# Patient Record
Sex: Male | Born: 1955 | Race: White | Hispanic: No | Smoking: Former smoker
Health system: Southern US, Community
[De-identification: ages and names within clinical notes are randomized; demographics above are authoritative.]

## PROBLEM LIST (undated history)

## (undated) DIAGNOSIS — I10 Essential (primary) hypertension: Secondary | ICD-10-CM

## (undated) DIAGNOSIS — R569 Unspecified convulsions: Secondary | ICD-10-CM

## (undated) DIAGNOSIS — E7800 Pure hypercholesterolemia, unspecified: Secondary | ICD-10-CM

## (undated) HISTORY — PX: HERNIA REPAIR: SHX51

## (undated) HISTORY — PX: TONSILLECTOMY: SUR1361

---

## 2015-11-06 ENCOUNTER — Encounter (HOSPITAL_COMMUNITY): Payer: Self-pay | Admitting: Emergency Medicine

## 2015-11-06 ENCOUNTER — Observation Stay (HOSPITAL_COMMUNITY)
Admission: EM | Admit: 2015-11-06 | Discharge: 2015-11-08 | Disposition: A | Discharge status: Home / Self Care | Attending: Internal Medicine | Admitting: Internal Medicine

## 2015-11-06 ENCOUNTER — Other Ambulatory Visit: Payer: Self-pay

## 2015-11-06 DIAGNOSIS — N400 Enlarged prostate without lower urinary tract symptoms: Secondary | ICD-10-CM | POA: Diagnosis not present

## 2015-11-06 DIAGNOSIS — S82891A Other fracture of right lower leg, initial encounter for closed fracture: Secondary | ICD-10-CM

## 2015-11-06 DIAGNOSIS — M79672 Pain in left foot: Secondary | ICD-10-CM | POA: Diagnosis not present

## 2015-11-06 DIAGNOSIS — Z87891 Personal history of nicotine dependence: Secondary | ICD-10-CM | POA: Diagnosis not present

## 2015-11-06 DIAGNOSIS — E785 Hyperlipidemia, unspecified: Secondary | ICD-10-CM | POA: Insufficient documentation

## 2015-11-06 DIAGNOSIS — I4581 Long QT syndrome: Secondary | ICD-10-CM | POA: Diagnosis not present

## 2015-11-06 DIAGNOSIS — R0602 Shortness of breath: Secondary | ICD-10-CM | POA: Insufficient documentation

## 2015-11-06 DIAGNOSIS — Z79899 Other long term (current) drug therapy: Secondary | ICD-10-CM | POA: Diagnosis not present

## 2015-11-06 DIAGNOSIS — I959 Hypotension, unspecified: Secondary | ICD-10-CM | POA: Diagnosis not present

## 2015-11-06 DIAGNOSIS — R9431 Abnormal electrocardiogram [ECG] [EKG]: Secondary | ICD-10-CM | POA: Diagnosis present

## 2015-11-06 DIAGNOSIS — Z23 Encounter for immunization: Secondary | ICD-10-CM | POA: Diagnosis not present

## 2015-11-06 DIAGNOSIS — S82863A Displaced Maisonneuve's fracture of unspecified leg, initial encounter for closed fracture: Secondary | ICD-10-CM | POA: Diagnosis not present

## 2015-11-06 DIAGNOSIS — S82401A Unspecified fracture of shaft of right fibula, initial encounter for closed fracture: Secondary | ICD-10-CM | POA: Diagnosis present

## 2015-11-06 DIAGNOSIS — M79671 Pain in right foot: Secondary | ICD-10-CM | POA: Diagnosis not present

## 2015-11-06 DIAGNOSIS — M25572 Pain in left ankle and joints of left foot: Secondary | ICD-10-CM | POA: Diagnosis not present

## 2015-11-06 DIAGNOSIS — G40909 Epilepsy, unspecified, not intractable, without status epilepticus: Secondary | ICD-10-CM | POA: Diagnosis not present

## 2015-11-06 DIAGNOSIS — W19XXXA Unspecified fall, initial encounter: Secondary | ICD-10-CM | POA: Insufficient documentation

## 2015-11-06 DIAGNOSIS — E7800 Pure hypercholesterolemia, unspecified: Secondary | ICD-10-CM | POA: Insufficient documentation

## 2015-11-06 DIAGNOSIS — E876 Hypokalemia: Secondary | ICD-10-CM | POA: Diagnosis not present

## 2015-11-06 DIAGNOSIS — R55 Syncope and collapse: Principal | ICD-10-CM | POA: Diagnosis present

## 2015-11-06 DIAGNOSIS — I10 Essential (primary) hypertension: Secondary | ICD-10-CM | POA: Insufficient documentation

## 2015-11-06 DIAGNOSIS — I952 Hypotension due to drugs: Secondary | ICD-10-CM

## 2015-11-06 DIAGNOSIS — S82861A Displaced Maisonneuve's fracture of right leg, initial encounter for closed fracture: Secondary | ICD-10-CM

## 2015-11-06 HISTORY — DX: Unspecified convulsions: R56.9

## 2015-11-06 HISTORY — DX: Essential (primary) hypertension: I10

## 2015-11-06 HISTORY — DX: Pure hypercholesterolemia, unspecified: E78.00

## 2015-11-06 LAB — CBC
HCT: 39.2 % (ref 39.0–52.0)
Hemoglobin: 13.2 g/dL (ref 13.0–17.0)
MCH: 30.9 pg (ref 26.0–34.0)
MCHC: 33.7 g/dL (ref 30.0–36.0)
MCV: 91.8 fL (ref 78.0–100.0)
PLATELETS: 197 10*3/uL (ref 150–400)
RBC: 4.27 MIL/uL (ref 4.22–5.81)
RDW: 12.4 % (ref 11.5–15.5)
WBC: 8.1 10*3/uL (ref 4.0–10.5)

## 2015-11-06 MED ORDER — SODIUM CHLORIDE 0.9 % IV BOLUS (SEPSIS)
1000.0000 mL | Freq: Once | INTRAVENOUS | Status: AC
  Administered 2015-11-07: 1000 mL via INTRAVENOUS

## 2015-11-06 NOTE — ED Notes (Signed)
Bed: [id number] Expected date:  Expected time:  Means of arrival:  Comments: EMS 59yo M syncopal episode, hypotensive

## 2015-11-06 NOTE — ED Notes (Signed)
Pt presents via EMS from home c/o brief LOC and hypotension this evening.  EMS reports BP as low as 77/44 en route with a return to 88/58 after 200cc fluid bolus.  EKG NSR, all other vitals WNL.  Pt is alert and oriented, reporting hx of similar episodes as well as seizure disorder.  No acute distress.

## 2015-11-06 NOTE — ED Provider Notes (Signed)
CSN: [id number]     Arrival date & time 11/06/15  2331 History  By signing my name below, I, Doreatha Martin, attest that this documentation has been prepared under the direction and in the presence of Melene Plan, DO. Electronically Signed: Doreatha Martin, ED Scribe. 11/06/2015. 12:16 AM.    Chief Complaint  Patient presents with  . Loss of Consciousness  . Hypotension   Patient is a 60 y.o. male presenting with syncope. The history is provided by the patient. No language interpreter was used.  Loss of Consciousness Episode history:  Single Most recent episode:  Today Timing:  Constant Progression:  Resolved Chronicity:  Recurrent Context: medication change and standing up   Witnessed: yes   Relieved by:  None tried Worsened by:  Nothing tried Ineffective treatments:  None tried Associated symptoms: dizziness   Associated symptoms: no chest pain, no difficulty breathing, no fever, no headaches, no recent fall and no shortness of breath   Risk factors: no coronary artery disease     HPI Comments: Alec Brown is a 60 y.o. male with h/o seizures, HTN, hypercholesterolemia who presents to the Emergency Department due to a brief episode of syncope and witnessed fall this evening after walking to the kitchen from the couch and experiencing transient dizziness. No head injury. Pt states h/o syncopal episodes. He denies CP, SOB, HA prior to syncope. He states he has not had changes in appetite or thirst. Pt notes that he took Flomax for the first time in months this evening and notes that it causes him dizziness. Per EMS, BP was 77/44 en route and showed improvement to 88/58 after 200cc fluid bolus. EKG NSR. Pt also complains of left ankle and knee pain. [facility] [place] TDAP UTD. He is not followed by an orthopedist. He denies cough, congestion, fever, chills, abdominal pain, CP, back pain, dysuria, melena, hematochezia, hip pain.  Past Medical History  Diagnosis Date  .  Seizures (HCC)   . Hypertension   . Hypercholesterolemia    Past Surgical History  Procedure Laterality Date  . Tonsillectomy    . Hernia repair     History reviewed. No pertinent family history. Social History  Substance Use Topics  . Smoking status: Former Games developer  . Smokeless tobacco: Never Used  . Alcohol Use: Yes     Comment: occasional    Review of Systems  Constitutional: Negative for fever and chills.  HENT: Negative for congestion.   Respiratory: Negative for cough and shortness of breath.   Cardiovascular: Positive for syncope. Negative for chest pain.  Gastrointestinal: Negative for abdominal pain and blood in stool.  Musculoskeletal: Positive for arthralgias.  Neurological: Positive for dizziness and syncope. Negative for headaches.  All other systems reviewed and are negative.  Allergies  Review of patient's allergies indicates no known allergies.  Home Medications   Prior to Admission medications   Medication Sig Start Date End Date Taking? Authorizing Provider  acetaminophen (TYLENOL) 500 MG tablet Take 500 mg by mouth every 6 (six) hours as needed (for pain.).   Yes Historical Provider, MD  hydrochlorothiazide (HYDRODIURIL) 25 MG tablet Take 12.5 mg by mouth daily.   Yes Historical Provider, MD  ibuprofen (ADVIL,MOTRIN) 200 MG tablet Take 200 mg by mouth every 6 (six) hours as needed (for pain.).   Yes Historical Provider, MD  meloxicam (MOBIC) 7.5 MG tablet Take 7.5 mg by mouth daily.   Yes Historical Provider, MD  potassium chloride SA (K-DUR,KLOR-CON) 20  MEQ tablet Take 40 mEq by mouth daily.   Yes Historical Provider, MD  prazosin (MINIPRESS) 5 MG capsule Take 5 mg by mouth at bedtime.   Yes Historical Provider, MD  simvastatin (ZOCOR) 80 MG tablet Take 120 mg by mouth daily.   Yes Historical Provider, MD  tamsulosin (FLOMAX) 0.4 MG CAPS capsule Take 0.4 mg by mouth.   Yes Historical Provider, MD  traZODone (DESYREL) 50 MG tablet Take 50 mg by mouth at  bedtime.   Yes Historical Provider, MD  vardenafil (LEVITRA) 20 MG tablet Take 20 mg by mouth daily as needed for erectile dysfunction.   Yes Historical Provider, MD  venlafaxine XR (EFFEXOR-XR) 150 MG 24 hr capsule Take 150 mg by mouth daily with breakfast.   Yes Historical Provider, MD  vitamin B-12 (CYANOCOBALAMIN) 500 MCG tablet Take 1,000 mcg by mouth daily.   Yes Historical Provider, MD   BP 101/61 mmHg  Pulse 74  Temp(Src) 97.5 F (36.4 C) (Oral)  Resp 19  Ht 5\' 8"  (1.727 m)  Wt 235 lb (106.595 kg)  BMI 35.74 kg/m2  SpO2 94% Physical Exam  Constitutional: He is oriented to person, place, and time. He appears well-developed and well-nourished.  HENT:  Head: Normocephalic and atraumatic.  Eyes: Conjunctivae and EOM are normal. Pupils are equal, round, and reactive to light.  Neck: Normal range of motion. Neck supple.  Cardiovascular: Normal rate.   Pulmonary/Chest: Effort normal. No respiratory distress.  Abdominal: He exhibits no distension.  Musculoskeletal: Normal range of motion. He exhibits tenderness.       Right ankle: He exhibits deformity.  Crepitance and deformity of the right ankle. Pain with palpation of the fibular head. Pulse, motor and sensation intact distally and bilaterally. No hip pain or pain with interior or exterior rotation of the hip.   Neurological: He is alert and oriented to person, place, and time.  Skin: Skin is warm and dry.  Psychiatric: He has a normal mood and affect. His behavior is normal.  Nursing note and vitals reviewed.   ED Course  Procedures (including critical care time) DIAGNOSTIC STUDIES: Oxygen Saturation is 92% on RA, low by my interpretation.    COORDINATION OF CARE: 12:03 AM Discussed treatment plan with pt at bedside and pt agreed to plan.   Labs Review Labs Reviewed  BASIC METABOLIC PANEL - Abnormal; Notable for the following:    Potassium 3.2 (*)    Glucose, Bld 138 (*)    Calcium 8.4 (*)    All other components  within normal limits  URINALYSIS, ROUTINE W REFLEX MICROSCOPIC (NOT AT [facility]) - Abnormal; Notable for the following:    APPearance CLOUDY (*)    All other components within normal limits  CBG MONITORING, ED - Abnormal; Notable for the following:    Glucose-Capillary 124 (*)    All other components within normal limits  CBC  BASIC METABOLIC PANEL  I-STAT TROPOININ, ED    Imaging Review Dg Chest 2 View  11/07/2015  CLINICAL DATA:  Acute onset of seizure and fall. Shortness of breath. Initial encounter. EXAM: CHEST  2 VIEW COMPARISON:  None. FINDINGS: The lungs are well-aerated and clear. There is no evidence of focal opacification, pleural effusion or pneumothorax. The heart is normal in size; the mediastinal contour is within normal limits. No acute osseous abnormalities are seen. IMPRESSION: No acute cardiopulmonary process seen. No displaced rib fractures identified. Electronically Signed   By: Roanna Raider M.D.   On: 11/07/2015 02:03  Dg Knee 2 Views Right  11/07/2015  CLINICAL DATA:  Status post seizure and fall. Right lateral knee pain. Initial encounter. EXAM: RIGHT KNEE - 1-2 VIEW COMPARISON:  None. FINDINGS: There is a minimally displaced fracture through the proximal fibular diaphysis, demonstrating mild anterior and lateral displacement. No additional fractures are seen. No knee joint effusion is seen. The knee joint is grossly unremarkable in appearance, aside from mild marginal osteophyte formation at the medial compartment. Mild soft tissue swelling is noted overlying the fracture. IMPRESSION: Minimally displaced fracture through the proximal fibular diaphysis, demonstrating mild anterior and lateral displacement. Electronically Signed   By: Roanna Raider M.D.   On: 11/07/2015 02:08   Dg Ankle Complete Left  11/07/2015  CLINICAL DATA:  Status post fall, with left lateral ankle and foot pain. Initial encounter. EXAM: LEFT ANKLE COMPLETE - 3+ VIEW COMPARISON:  None. FINDINGS: There is  no evidence of fracture or dislocation. The ankle mortise is intact; the interosseous space is within normal limits. No talar tilt or subluxation is seen. The joint spaces are preserved. No significant soft tissue abnormalities are seen. IMPRESSION: No evidence of fracture or dislocation. Electronically Signed   By: Roanna Raider M.D.   On: 11/07/2015 02:09   Dg Ankle Complete Right  11/07/2015  CLINICAL DATA:  Status post seizure and fall. Right medial ankle abrasion. Initial encounter. EXAM: RIGHT ANKLE - COMPLETE 3+ VIEW COMPARISON:  None. FINDINGS: There is a mildly displaced fracture through the distal fibula. No additional fractures are seen. The ankle mortise is grossly unremarkable in appearance. The interosseous space appears grossly intact. The subtalar joint is unremarkable. Os trigonum fragments are noted. Lateral soft tissue swelling is noted. IMPRESSION: 1. Mildly displaced fracture through the distal fibula. 2. Os trigonum fragments noted. Electronically Signed   By: Roanna Raider M.D.   On: 11/07/2015 02:11   Dg Foot Complete Left  11/07/2015  CLINICAL DATA:  Status post seizure and fall. Left foot pain. Initial encounter. EXAM: LEFT FOOT - COMPLETE 3+ VIEW COMPARISON:  None. FINDINGS: There is no evidence of fracture or dislocation. The joint spaces are preserved. There is no evidence of talar subluxation; the subtalar joint is unremarkable in appearance. Mild hallux valgus is noted. No significant soft tissue abnormalities are seen. IMPRESSION: 1. No evidence of fracture or dislocation. 2. Mild hallux valgus noted. Electronically Signed   By: Roanna Raider M.D.   On: 11/07/2015 02:06   Dg Foot Complete Right  11/07/2015  CLINICAL DATA:  Status post seizure and fall. Medial right foot pain. Initial encounter. EXAM: RIGHT FOOT COMPLETE - 3+ VIEW COMPARISON:  None. FINDINGS: There is no evidence of fracture or dislocation. The joint spaces are preserved. There is no evidence of talar  subluxation; the subtalar joint is unremarkable in appearance. An os trigonum is noted. No significant soft tissue abnormalities are seen. IMPRESSION: 1. No evidence of fracture or dislocation. 2. Os trigonum noted. Electronically Signed   By: Roanna Raider M.D.   On: 11/07/2015 02:04   I have personally reviewed and evaluated these images and lab results as part of my medical decision-making.   EKG Interpretation   Date/Time:  Monday November 06 2015 23:38:19 EST Ventricular Rate:  74 PR Interval:  150 QRS Duration: 101 QT Interval:  462 QTC Calculation: 513 R Axis:   35 Text Interpretation:  Sinus rhythm Borderline low voltage, extremity leads  Prolonged QT interval No old tracing to compare Confirmed by Donyae Kilner MD,  DANIEL [phone])  on 11/07/2015 6:51:08 AM      MDM   Final diagnoses:  Vasovagal syncope  Closed right ankle fracture, initial encounter  Maisonneuve fracture of fibula, right, closed, initial encounter  Hypotension due to drugs    60 yo M with a chief complaints of syncope. Patient took his Flomax for the first time in a few months stood up and felt lightheaded was only able to make a few steps and then passed out. In the injury rolled his ankle and having severe right ankle and right knee pain. Some mild left foot pain as well. Denied head injury denies neck pain back pain chest pain abdominal pain.  Plain films of the right ankle with distal and proximal fibular fx.  The patient was given 2 L of IV fluids with persistent hypotension. Will admit.  CRITICAL CARE Performed by: Rae Roamaniel Patrick Valynn Schamberger   Total critical care time: 40 minutes  Critical care time was exclusive of separately billable procedures and treating other patients.  Critical care was necessary to treat or prevent imminent or life-threatening deterioration.  Critical care was time spent personally by me on the following activities: development of treatment plan with patient and/or surrogate as well  as nursing, discussions with consultants, evaluation of patient's response to treatment, examination of patient, obtaining history from patient or surrogate, ordering and performing treatments and interventions, ordering and review of laboratory studies, ordering and review of radiographic studies, pulse oximetry and re-evaluation of patient's condition.  I personally performed the services described in this documentation, which was scribed in my presence. The recorded information has been reviewed and is accurate.    The patients results and plan were reviewed and discussed.   Any x-rays performed were independently reviewed by myself.   Differential diagnosis were considered with the presenting HPI.  Medications  acetaminophen (TYLENOL) tablet 500 mg (not administered)  ibuprofen (ADVIL,MOTRIN) tablet 200 mg (not administered)  meloxicam (MOBIC) tablet 7.5 mg (not administered)  potassium chloride SA (K-DUR,KLOR-CON) CR tablet 40 mEq (not administered)  atorvastatin (LIPITOR) tablet 40 mg (not administered)  venlafaxine XR (EFFEXOR-XR) 24 hr capsule 150 mg (not administered)  vitamin B-12 (CYANOCOBALAMIN) tablet 1,000 mcg (not administered)  enoxaparin (LOVENOX) injection 40 mg (not administered)  sodium chloride 0.9 % injection 3 mL (not administered)  0.9 %  sodium chloride infusion ( Intravenous New Bag/Given 11/07/15 0458)  HYDROcodone-acetaminophen (NORCO/VICODIN) 5-325 MG per tablet 1 tablet (not administered)  ondansetron (ZOFRAN) tablet 4 mg (not administered)    Or  ondansetron (ZOFRAN) injection 4 mg (not administered)  morphine 2 MG/ML injection 2 mg (not administered)  Influenza vac split quadrivalent PF (FLUARIX) injection 0.5 mL (not administered)  sodium chloride 0.9 % bolus 1,000 mL (0 mLs Intravenous Stopped 11/07/15 0238)  fentaNYL (SUBLIMAZE) injection 50 mcg (50 mcg Intravenous Given 11/07/15 0030)  sodium chloride 0.9 % bolus 1,000 mL (1,000 mLs Intravenous New Bag/Given  11/07/15 0242)  potassium chloride SA (K-DUR,KLOR-CON) CR tablet 40 mEq (40 mEq Oral Given 11/07/15 0257)  fentaNYL (SUBLIMAZE) injection 50 mcg (50 mcg Intravenous Given 11/07/15 0422)    Filed Vitals:   11/07/15 0100 11/07/15 0115 11/07/15 0420 11/07/15 0449  BP: 68/46 88/58 101/63 101/61  Pulse: 75 74 75 74  Temp:    97.5 F (36.4 C)  TempSrc:    Oral  Resp: 15 15 16 19   Height:    5\' 8"  (1.727 m)  Weight:      SpO2: 91% 90% 94% 94%    Final  diagnoses:  Vasovagal syncope  Closed right ankle fracture, initial encounter  Maisonneuve fracture of fibula, right, closed, initial encounter  Hypotension due to drugs    Admission/ observation were discussed with the admitting physician, patient and/or family and they are comfortable with the plan.    Melene Plan, DO 11/07/15 [phone]

## 2015-11-07 ENCOUNTER — Emergency Department (HOSPITAL_COMMUNITY)

## 2015-11-07 DIAGNOSIS — S82863A Displaced Maisonneuve's fracture of unspecified leg, initial encounter for closed fracture: Secondary | ICD-10-CM | POA: Diagnosis not present

## 2015-11-07 DIAGNOSIS — I10 Essential (primary) hypertension: Secondary | ICD-10-CM | POA: Diagnosis not present

## 2015-11-07 DIAGNOSIS — S82891A Other fracture of right lower leg, initial encounter for closed fracture: Secondary | ICD-10-CM | POA: Insufficient documentation

## 2015-11-07 DIAGNOSIS — N400 Enlarged prostate without lower urinary tract symptoms: Secondary | ICD-10-CM | POA: Diagnosis present

## 2015-11-07 DIAGNOSIS — S82401A Unspecified fracture of shaft of right fibula, initial encounter for closed fracture: Secondary | ICD-10-CM | POA: Diagnosis present

## 2015-11-07 DIAGNOSIS — I959 Hypotension, unspecified: Secondary | ICD-10-CM | POA: Diagnosis present

## 2015-11-07 DIAGNOSIS — R55 Syncope and collapse: Secondary | ICD-10-CM | POA: Diagnosis present

## 2015-11-07 DIAGNOSIS — E876 Hypokalemia: Secondary | ICD-10-CM | POA: Diagnosis not present

## 2015-11-07 DIAGNOSIS — R9431 Abnormal electrocardiogram [ECG] [EKG]: Secondary | ICD-10-CM | POA: Diagnosis present

## 2015-11-07 LAB — BASIC METABOLIC PANEL
ANION GAP: 6 (ref 5–15)
BUN: 14 mg/dL (ref 6–20)
CHLORIDE: 105 mmol/L (ref 101–111)
CO2: 28 mmol/L (ref 22–32)
Calcium: 8.2 mg/dL — ABNORMAL LOW (ref 8.9–10.3)
Creatinine, Ser: 1.09 mg/dL (ref 0.61–1.24)
Glucose, Bld: 135 mg/dL — ABNORMAL HIGH (ref 65–99)
POTASSIUM: 3.6 mmol/L (ref 3.5–5.1)
SODIUM: 139 mmol/L (ref 135–145)

## 2015-11-07 LAB — CBG MONITORING, ED: GLUCOSE-CAPILLARY: 124 mg/dL — AB (ref 65–99)

## 2015-11-07 LAB — URINALYSIS, ROUTINE W REFLEX MICROSCOPIC
Bilirubin Urine: NEGATIVE
Glucose, UA: NEGATIVE mg/dL
Hgb urine dipstick: NEGATIVE
Ketones, ur: NEGATIVE mg/dL
Leukocytes, UA: NEGATIVE
Nitrite: NEGATIVE
Protein, ur: NEGATIVE mg/dL
Specific Gravity, Urine: 1.011 (ref 1.005–1.030)
pH: 6.5 (ref 5.0–8.0)

## 2015-11-07 LAB — BASIC METABOLIC PANEL WITH GFR
Anion gap: 10 (ref 5–15)
BUN: 12 mg/dL (ref 6–20)
CO2: 27 mmol/L (ref 22–32)
Calcium: 8.4 mg/dL — ABNORMAL LOW (ref 8.9–10.3)
Chloride: 105 mmol/L (ref 101–111)
Creatinine, Ser: 1.08 mg/dL (ref 0.61–1.24)
GFR calc Af Amer: >60 mL/min
GFR calc non Af Amer: >60 mL/min
Glucose, Bld: 138 mg/dL — ABNORMAL HIGH (ref 65–99)
Potassium: 3.2 mmol/L — ABNORMAL LOW (ref 3.5–5.1)
Sodium: 142 mmol/L (ref 135–145)

## 2015-11-07 LAB — I-STAT TROPONIN, ED: TROPONIN I, POC: 0 ng/mL (ref 0.00–0.08)

## 2015-11-07 MED ORDER — FENTANYL CITRATE (PF) 100 MCG/2ML IJ SOLN
50.0000 ug | Freq: Once | INTRAMUSCULAR | Status: AC
Start: 2015-11-07 — End: 2015-11-07
  Administered 2015-11-07: 50 ug via INTRAVENOUS
  Filled 2015-11-07: qty 2

## 2015-11-07 MED ORDER — SODIUM CHLORIDE 0.9 % IV SOLN
INTRAVENOUS | Status: DC
  Administered 2015-11-07: 05:00:00 via INTRAVENOUS

## 2015-11-07 MED ORDER — ONDANSETRON HCL 4 MG/2ML IJ SOLN: 4.0000 mg | Freq: Four times a day (QID) | INTRAMUSCULAR | Status: DC | PRN

## 2015-11-07 MED ORDER — SODIUM CHLORIDE 0.9 % IJ SOLN
3.0000 mL | Freq: Two times a day (BID) | INTRAMUSCULAR | Status: DC
  Administered 2015-11-07 – 2015-11-08 (×3): 3 mL via INTRAVENOUS

## 2015-11-07 MED ORDER — ATORVASTATIN CALCIUM 40 MG PO TABS
40.0000 mg | ORAL_TABLET | Freq: Every day | ORAL | Status: DC
  Administered 2015-11-07: 40 mg via ORAL
  Filled 2015-11-07: qty 1

## 2015-11-07 MED ORDER — POTASSIUM CHLORIDE CRYS ER 20 MEQ PO TBCR
40.0000 meq | EXTENDED_RELEASE_TABLET | Freq: Every day | ORAL | Status: DC
  Administered 2015-11-07 – 2015-11-08 (×2): 40 meq via ORAL
  Filled 2015-11-07 (×2): qty 2

## 2015-11-07 MED ORDER — ENOXAPARIN SODIUM 40 MG/0.4ML ~~LOC~~ SOLN
40.0000 mg | SUBCUTANEOUS | Status: DC
  Administered 2015-11-07 – 2015-11-08 (×2): 40 mg via SUBCUTANEOUS
  Filled 2015-11-07 (×2): qty 0.4

## 2015-11-07 MED ORDER — SODIUM CHLORIDE 0.9 % IV SOLN
INTRAVENOUS | Status: DC
  Administered 2015-11-07 (×2): via INTRAVENOUS

## 2015-11-07 MED ORDER — MELOXICAM 7.5 MG PO TABS
7.5000 mg | ORAL_TABLET | Freq: Every day | ORAL | Status: DC
  Administered 2015-11-07 – 2015-11-08 (×2): 7.5 mg via ORAL
  Filled 2015-11-07 (×2): qty 1

## 2015-11-07 MED ORDER — SODIUM CHLORIDE 0.9 % IV BOLUS (SEPSIS)
1000.0000 mL | Freq: Once | INTRAVENOUS | Status: AC
  Administered 2015-11-07: 1000 mL via INTRAVENOUS

## 2015-11-07 MED ORDER — FENTANYL CITRATE (PF) 100 MCG/2ML IJ SOLN
50.0000 ug | Freq: Once | INTRAMUSCULAR | Status: AC
  Administered 2015-11-07: 50 ug via INTRAVENOUS
  Filled 2015-11-07: qty 2

## 2015-11-07 MED ORDER — VENLAFAXINE HCL ER 150 MG PO CP24
150.0000 mg | ORAL_CAPSULE | Freq: Every day | ORAL | Status: DC
  Administered 2015-11-07 – 2015-11-08 (×2): 150 mg via ORAL
  Filled 2015-11-07: qty 1
  Filled 2015-11-07: qty 2
  Filled 2015-11-07 (×2): qty 1
  Filled 2015-11-07: qty 2

## 2015-11-07 MED ORDER — IBUPROFEN 200 MG PO TABS: 200.0000 mg | ORAL_TABLET | Freq: Four times a day (QID) | ORAL | Status: DC | PRN

## 2015-11-07 MED ORDER — ONDANSETRON HCL 4 MG PO TABS: 4.0000 mg | ORAL_TABLET | Freq: Four times a day (QID) | ORAL | Status: DC | PRN

## 2015-11-07 MED ORDER — HYDROCODONE-ACETAMINOPHEN 5-325 MG PO TABS
1.0000 | ORAL_TABLET | ORAL | Status: DC | PRN
  Administered 2015-11-07 – 2015-11-08 (×6): 1 via ORAL
  Filled 2015-11-07 (×6): qty 1

## 2015-11-07 MED ORDER — MORPHINE SULFATE (PF) 2 MG/ML IV SOLN: 2.0000 mg | INTRAVENOUS | Status: DC | PRN

## 2015-11-07 MED ORDER — VITAMIN B-12 1000 MCG PO TABS
1000.0000 ug | ORAL_TABLET | Freq: Every day | ORAL | Status: DC
  Administered 2015-11-07 – 2015-11-08 (×2): 1000 ug via ORAL
  Filled 2015-11-07 (×2): qty 1

## 2015-11-07 MED ORDER — ACETAMINOPHEN 500 MG PO TABS: 500.0000 mg | ORAL_TABLET | Freq: Four times a day (QID) | ORAL | Status: DC | PRN

## 2015-11-07 MED ORDER — INFLUENZA VAC SPLIT QUAD 0.5 ML IM SUSY
0.5000 mL | PREFILLED_SYRINGE | INTRAMUSCULAR | Status: AC
  Administered 2015-11-08: 0.5 mL via INTRAMUSCULAR
  Filled 2015-11-07 (×2): qty 0.5

## 2015-11-07 MED ORDER — POTASSIUM CHLORIDE CRYS ER 20 MEQ PO TBCR
40.0000 meq | EXTENDED_RELEASE_TABLET | Freq: Once | ORAL | Status: AC
  Administered 2015-11-07: 40 meq via ORAL
  Filled 2015-11-07: qty 2

## 2015-11-07 NOTE — Evaluation (Signed)
Physical Therapy Evaluation Patient Details Name: Alec Brown MRN: [id number] DOB: October 21, 1956 Today's Date: 11/07/2015   History of Present Illness  Alec Brown is a 60 y.o. male who complains of Right ankle injury after syncopal episode 11/05/14. Xrays in ED showed distal fibula fx and fibular neck fx. Splint applied in ED. NWB R foot  Clinical Impression  Patient  Presents with decreased  abilty to ambulate with NWB on the Right. Instructed in pivot transfers to from [facility]. Did perform 1 stand pivot transfer with RW with mod assist.. Reports tenderness of the medial Right  ankle with weight bearing, somewhat limiting.patient will benefit from  PT while in acute care to improve in safe transfers to DC to home. Patient indicates that he feels  That he can ambulate at DC.  REcommend  A WC  At least initially as patient's transfer unsafe.   Follow Up Recommendations Home health PT;Supervision/Assistance - 24 hour    Equipment Recommendations  Wheelchair (measurements PT)    Recommendations for Other Services       Precautions / Restrictions Precautions Precautions: Fall Precaution Comments: syncope spell at home Restrictions Weight Bearing Restrictions: Yes RLE Weight Bearing: Non weight bearing      Mobility  Bed Mobility Overal bed mobility: Needs Assistance Bed Mobility: Supine to Sit;Sit to Supine     Supine to sit: Min assist Sit to supine: Supervision   General bed mobility comments: assist with the trunk upright.  Transfers Overall transfer level: Needs assistance Equipment used: Rolling walker (2 wheeled);Pushed w/c Transfers: Sit to/from [employer] Sit to Stand: Mod assist Stand pivot transfers: Mod assist       General transfer comment: from bed  with mod max assist to stand. Noted difficulty powering up to stand with L leg only. Patient noted to stand somewhat easier from toilet with the rail, still requiring min assist for safety. Patient able to  maintain NWB on the R. Patient then transferred with stand and pivot hops with RW with mod assist for safety. fro WC to bed.  Ambulation/Gait             General Gait Details: NT, not ready to hop due to LLE not strong enough. needs a L shoe also.  Stairs            Wheelchair Mobility    Modified Rankin (Stroke Patients Only)       Balance Overall balance assessment: History of Falls                                           Pertinent Vitals/Pain Pain Assessment: 0-10 Pain Score: 8  Pain Location: right foot Pain Descriptors / Indicators: Aching;Burning;Tightness Pain Intervention(s): Limited activity within patient's tolerance;Monitored during session;Repositioned;Ice applied;Premedicated before session    Home Living Family/patient expects to be discharged to:: Private residence Living Arrangements: Spouse/significant other;Other relatives Available Help at Discharge: Family;Friend(s);Available 24 hours/day Type of Home: Apartment Home Access: Level entry     Home Layout: One level Home Equipment: Walker - 2 wheels;Crutches      Prior Function Level of Independence: Independent               Hand Dominance        Extremity/Trunk Assessment   Upper Extremity Assessment: Overall WFL for tasks assessed           Lower Extremity Assessment:  LLE deficits/detail;RLE deficits/detail RLE Deficits / Details: able to lift from bed LLE Deficits / Details: difficulty with sit to stand from low surface, power up assist. did better with a rail in the bathroom and [facility] with [facility] rests.  Cervical / Trunk Assessment: Normal  Communication   Communication: No difficulties  Cognition Arousal/Alertness: Awake/alert Behavior During Therapy: Impulsive Overall Cognitive Status: Within Functional Limits for tasks assessed                      General Comments      Exercises        Assessment/Plan    PT Assessment  Patient needs continued PT services  PT Diagnosis Difficulty walking;Generalized weakness;Acute pain   PT Problem List Decreased strength;Decreased activity tolerance;Decreased balance;Decreased mobility;Decreased knowledge of use of DME;Decreased safety awareness;Decreased knowledge of precautions  PT Treatment Interventions DME instruction;Gait training;Functional mobility training;Therapeutic activities;Therapeutic exercise;Patient/family education   PT Goals (Current goals can be found in the Care Plan section) Acute Rehab PT Goals Patient Stated Goal: I can walk with that RW. PT Goal Formulation: With patient/family Time For Goal Achievement: 11/21/15 Potential to Achieve Goals: Good    Frequency Min 6X/week   Barriers to discharge        Co-evaluation               End of Session Equipment Utilized During Treatment: Gait belt Activity Tolerance: Patient tolerated treatment well;Patient limited by pain Patient left: in bed;with call bell/phone within reach;with bed alarm set;with family/visitor present Nurse Communication: Mobility status;Precautions    Functional Assessment Tool Used: clinical judgemnt Functional Limitation: Mobility: Walking and moving around Mobility: Walking and Moving Around Current Status ([id number](G8978): At least 80 percent but less than 100 percent impaired, limited or restricted Mobility: Walking and Moving Around Goal Status [phone](G8979): At least 1 percent but less than 20 percent impaired, limited or restricted    Time: [id number] PT Time Calculation (min) (ACUTE ONLY): 50 min   Charges:   PT Evaluation $PT Eval Moderate Complexity: 1 Procedure PT Treatments $Therapeutic Activity: 23-37 mins   PT G Codes:   PT G-Codes **NOT FOR INPATIENT CLASS** Functional Assessment Tool Used: clinical judgemnt Functional Limitation: Mobility: Walking and moving around Mobility: Walking and Moving Around Current Status (Z3664(G8978): At least 80 percent but less than  100 percent impaired, limited or restricted Mobility: Walking and Moving Around Goal Status [phone](G8979): At least 1 percent but less than 20 percent impaired, limited or restricted    Rada HayHill, Saurabh Hettich [name] 11/07/2015, 2:45 PM Blanchard KelchKaren Mana Morison PT [phone]

## 2015-11-07 NOTE — Progress Notes (Signed)
PT Cancellation Note  Patient Details Name: Alec MulletStacy Brown MRN: [id number] DOB: 1955-11-11   Cancelled Treatment:    Reason Eval/Treat Not Completed: Patient not medically ready (note fibular fxs.with no weight bearing status ordered. Will hold PT eval until clarified. ) no indication of ortho consult noted. Will follow up later for clarification.    Alec HayHill, Alec Brown [name] 11/07/2015, 7:00 AM Alec KelchKaren Tracee Brown PT [phone]

## 2015-11-07 NOTE — Progress Notes (Signed)
Patient reported he has an appointment with DR Jeri LagerGuha today, so he remembered to take him meds ( prazosin, flomax and trazodone) last night which he has not been taking for a long time, syncope happened two hrs after taking these meds, per girlfriend, no witnessed seizure activity.  He presented with hypotension, he has orthostatic hypotension, home bp meds held, on ivf, check tsh/corticol. Right ankle injury, plan per orhto  PMD Dr Carleene CooperGuha, [facility] [phone].

## 2015-11-07 NOTE — H&P (Signed)
[facility] History and Physical  Alec Brown ZOX:[id number] DOB: 1956/10/05 DOA: 11/06/2015  Referring physician: ED PCP: Pcp Not In System   Chief Complaint: Syncope  HPI:  Alec Brown is a 60 year old male with a past medical history of seizure disorder, hypertension, hyperlipidemia, BPH; who presents to the emergency department after having a syncopal event. Symptoms occurred around 10:30 PM after he had just taken his nighttime medications of trazodone, Flomax, and prazosin. This was the first time he had taken Flomax which was just prescribed. As he was walking from the couch he noted filling lightheaded. His significant other notes that she found him laying on the floor slightly confused and dazed for a few minutes. Patient also denies recently taking his erectile dysfunction medication. Denies any trauma to the head, possible bowel or bladder, seizure-like activity, or tongue biting. Denies having any syncopal episodes in the past. Yesterday, he took his blood pressure and his systolic blood pressure was in the 130's. He complains of left ankle and knee pain. Pain is not sharp in nature and worsened by any movement of the left leg.      Review of Systems  Constitutional: Negative for fever and chills.  HENT: Negative for ear discharge, ear pain and hearing loss.   Eyes: Negative for double vision and photophobia.  Respiratory: Negative for hemoptysis and sputum production.   Cardiovascular: Positive for leg swelling. Negative for chest pain and palpitations.  Gastrointestinal: Negative for nausea, vomiting and abdominal pain.  Genitourinary: Negative for urgency and frequency.  Musculoskeletal: Positive for joint pain and falls.  Skin: Negative for itching and rash.  Neurological: Positive for loss of consciousness. Negative for sensory change and seizures.  Endo/Heme/Allergies: Negative for environmental allergies and polydipsia.        Past Medical History  Diagnosis  Date  . Seizures (HCC)   . Hypertension   . Hypercholesterolemia      Past Surgical History  Procedure Laterality Date  . Tonsillectomy    . Hernia repair        Social History:  reports that he has quit smoking. He has never used smokeless tobacco. He reports that he drinks alcohol. He reports that he does not use illicit drugs. Where does patient live--home and with whom if at home? Can patient participate in ADLs? Yes  No Known Allergies  History reviewed. No pertinent family history.    FAMILY HISTORY  When questioned  Directly-patient reports  No family history of HTN, CVA ,DIABETES, TB, Cancer CAD, Bleeding Disorders, Sickle Cell, diabetes, anemia, asthma,   Prior to Admission medications   Medication Sig Start Date End Date Taking? Authorizing Provider  acetaminophen (TYLENOL) 500 MG tablet Take 500 mg by mouth every 6 (six) hours as needed (for pain.).   Yes Historical Provider, MD  hydrochlorothiazide (HYDRODIURIL) 25 MG tablet Take 12.5 mg by mouth daily.   Yes Historical Provider, MD  ibuprofen (ADVIL,MOTRIN) 200 MG tablet Take 200 mg by mouth every 6 (six) hours as needed (for pain.).   Yes Historical Provider, MD  meloxicam (MOBIC) 7.5 MG tablet Take 7.5 mg by mouth daily.   Yes Historical Provider, MD  potassium chloride SA (K-DUR,KLOR-CON) 20 MEQ tablet Take 40 mEq by mouth daily.   Yes Historical Provider, MD  prazosin (MINIPRESS) 5 MG capsule Take 5 mg by mouth at bedtime.   Yes Historical Provider, MD  simvastatin (ZOCOR) 80 MG tablet Take 120 mg by mouth daily.   Yes Historical Provider, MD  tamsulosin (  FLOMAX) 0.4 MG CAPS capsule Take 0.4 mg by mouth.   Yes Historical Provider, MD  traZODone (DESYREL) 50 MG tablet Take 50 mg by mouth at bedtime.   Yes Historical Provider, MD  vardenafil (LEVITRA) 20 MG tablet Take 20 mg by mouth daily as needed for erectile dysfunction.   Yes Historical Provider, MD  venlafaxine XR (EFFEXOR-XR) 150 MG 24 hr capsule Take 150  mg by mouth daily with breakfast.   Yes Historical Provider, MD  vitamin B-12 (CYANOCOBALAMIN) 500 MCG tablet Take 1,000 mcg by mouth daily.   Yes Historical Provider, MD     Physical Exam: Filed Vitals:   11/07/15 0030 11/07/15 0045 11/07/15 0100 11/07/15 0115  BP: 81/57 75/50 68/46  88/58  Pulse: 68 77 75 74  Temp:      TempSrc:      Resp: 17 16 15 15   Height:      Weight:      SpO2: 92% 93% 91% 90%     Constitutional: Vital signs reviewed. Patient is a well-developed and well-nourished in no acute distress and cooperative with exam. Alert and oriented x3.  Head: Normocephalic and atraumatic  Ear: TM normal bilaterally  Mouth: no erythema or exudates, MMM  Eyes: PERRL, EOMI, conjunctivae normal, No scleral icterus.  Neck: Supple, Trachea midline normal ROM, No JVD, mass, thyromegaly, or carotid bruit present.  Cardiovascular: RRR, S1 normal, S2 normal, no MRG, pulses symmetric and intact bilaterally  Pulmonary/Chest: CTAB, no wheezes, rales, or rhonchi  Abdominal: Soft. Non-tender, non-distended, bowel sounds are normal, no masses, organomegaly, or guarding present.  GU: no CVA tenderness Musculoskeletal: No joint deformities, erythema, or stiffness, decreased range of motion to the right ankle secondary to pain  Ext: no edema and no cyanosis, pulses palpable bilaterally (DP and PT)  Hematology: no cervical, inginal, or axillary adenopathy.  Neurological: A&O x3, Strenght is normal and symmetric bilaterally, cranial nerve II-XII are grossly intact, no focal motor deficit, sensory intact to light touch bilaterally.  Skin: Warm, dry and intact. No rash, cyanosis, or clubbing.  Psychiatric: Normal mood and affect. speech and behavior is normal. Judgment and thought content normal. Cognition and memory are normal.      Data Review   Micro Results No results found for this or any previous visit (from the past 240 hour(s)).  Radiology Reports Dg Chest 2 View  11/07/2015   CLINICAL DATA:  Acute onset of seizure and fall. Shortness of breath. Initial encounter. EXAM: CHEST  2 VIEW COMPARISON:  None. FINDINGS: The lungs are well-aerated and clear. There is no evidence of focal opacification, pleural effusion or pneumothorax. The heart is normal in size; the mediastinal contour is within normal limits. No acute osseous abnormalities are seen. IMPRESSION: No acute cardiopulmonary process seen. No displaced rib fractures identified. Electronically Signed   By: Roanna RaiderJeffery  Chang M.D.   On: 11/07/2015 02:03   Dg Knee 2 Views Right  11/07/2015  CLINICAL DATA:  Status post seizure and fall. Right lateral knee pain. Initial encounter. EXAM: RIGHT KNEE - 1-2 VIEW COMPARISON:  None. FINDINGS: There is a minimally displaced fracture through the proximal fibular diaphysis, demonstrating mild anterior and lateral displacement. No additional fractures are seen. No knee joint effusion is seen. The knee joint is grossly unremarkable in appearance, aside from mild marginal osteophyte formation at the medial compartment. Mild soft tissue swelling is noted overlying the fracture. IMPRESSION: Minimally displaced fracture through the proximal fibular diaphysis, demonstrating mild anterior and lateral displacement. Electronically Signed  By: Roanna Raider M.D.   On: 11/07/2015 02:08   Dg Ankle Complete Left  11/07/2015  CLINICAL DATA:  Status post fall, with left lateral ankle and foot pain. Initial encounter. EXAM: LEFT ANKLE COMPLETE - 3+ VIEW COMPARISON:  None. FINDINGS: There is no evidence of fracture or dislocation. The ankle mortise is intact; the interosseous space is within normal limits. No talar tilt or subluxation is seen. The joint spaces are preserved. No significant soft tissue abnormalities are seen. IMPRESSION: No evidence of fracture or dislocation. Electronically Signed   By: Roanna Raider M.D.   On: 11/07/2015 02:09   Dg Ankle Complete Right  11/07/2015  CLINICAL DATA:  Status post  seizure and fall. Right medial ankle abrasion. Initial encounter. EXAM: RIGHT ANKLE - COMPLETE 3+ VIEW COMPARISON:  None. FINDINGS: There is a mildly displaced fracture through the distal fibula. No additional fractures are seen. The ankle mortise is grossly unremarkable in appearance. The interosseous space appears grossly intact. The subtalar joint is unremarkable. Os trigonum fragments are noted. Lateral soft tissue swelling is noted. IMPRESSION: 1. Mildly displaced fracture through the distal fibula. 2. Os trigonum fragments noted. Electronically Signed   By: Roanna Raider M.D.   On: 11/07/2015 02:11   Dg Foot Complete Left  11/07/2015  CLINICAL DATA:  Status post seizure and fall. Left foot pain. Initial encounter. EXAM: LEFT FOOT - COMPLETE 3+ VIEW COMPARISON:  None. FINDINGS: There is no evidence of fracture or dislocation. The joint spaces are preserved. There is no evidence of talar subluxation; the subtalar joint is unremarkable in appearance. Mild hallux valgus is noted. No significant soft tissue abnormalities are seen. IMPRESSION: 1. No evidence of fracture or dislocation. 2. Mild hallux valgus noted. Electronically Signed   By: Roanna Raider M.D.   On: 11/07/2015 02:06   Dg Foot Complete Right  11/07/2015  CLINICAL DATA:  Status post seizure and fall. Medial right foot pain. Initial encounter. EXAM: RIGHT FOOT COMPLETE - 3+ VIEW COMPARISON:  None. FINDINGS: There is no evidence of fracture or dislocation. The joint spaces are preserved. There is no evidence of talar subluxation; the subtalar joint is unremarkable in appearance. An os trigonum is noted. No significant soft tissue abnormalities are seen. IMPRESSION: 1. No evidence of fracture or dislocation. 2. Os trigonum noted. Electronically Signed   By: Roanna Raider M.D.   On: 11/07/2015 02:04     CBC  Recent Labs Lab 11/06/15 2351  WBC 8.1  HGB 13.2  HCT 39.2  PLT 197  MCV 91.8  MCH 30.9  MCHC 33.7  RDW 12.4    Chemistries    Recent Labs Lab 11/06/15 2351  NA 142  K 3.2*  CL 105  CO2 27  GLUCOSE 138*  BUN 12  CREATININE 1.08  CALCIUM 8.4*   ------------------------------------------------------------------------------------------------------------------ estimated creatinine clearance is 87.2 mL/min (by C-G formula based on Cr of 1.08). ------------------------------------------------------------------------------------------------------------------ No results for input(s): HGBA1C in the last 72 hours. ------------------------------------------------------------------------------------------------------------------ No results for input(s): CHOL, HDL, LDLCALC, TRIG, CHOLHDL, LDLDIRECT in the last 72 hours. ------------------------------------------------------------------------------------------------------------------ No results for input(s): TSH, T4TOTAL, T3FREE, THYROIDAB in the last 72 hours.  Invalid input(s): FREET3 ------------------------------------------------------------------------------------------------------------------ No results for input(s): VITAMINB12, FOLATE, FERRITIN, TIBC, IRON, RETICCTPCT in the last 72 hours.  Coagulation profile No results for input(s): INR, PROTIME in the last 168 hours.  No results for input(s): DDIMER in the last 72 hours.  Cardiac Enzymes No results for input(s): CKMB, TROPONINI, MYOGLOBIN in the last 168 hours.  Invalid input(s): CK ------------------------------------------------------------------------------------------------------------------ Invalid input(s): POCBNP   CBG:  Recent Labs Lab 11/07/15 0011  GLUCAP 124*       EKG: Independently reviewed. Normal sinus rhythm with a prolonged QTC of 513   Assessment/Plan Active Problems:  Syncope: Acute. Given patient's history it appears to be secondary to low blood pressure from a combination of patient taking tramadol, Flomax, and prazosin at once. Patient was given 2 L of IV fluids  in the emergency department with improvement of blood pressures. - Admit to telemetry bed - Normal saline IV fluids - Check orthostatic vital signs 2  Hypotension: Patient has a previous history of hypertension. Initial blood pressures as low as 68/46 in the emergency department. -Continue to monitor closely -Held blood pressure medications of hydrochlorothiazide and prazosin  Fibular fractures: Acute. As seen of the distal and proximal right fibula.  - Consult orthopedics in the a.m.  - Hydrocodone for pain control as tolerated with holding parameters for blood pressure - Patient is npo  Hypokalemia: Acute. Initial potassium 3.2+ with 20 mEq of potassium chloride - Continue to monitor replace as needed. Repeat BMP ordered  Prolonged QTC: noted of 513 -f/u telemetry over night - Repeat EKG if needed   BPH -Held medications of Flomax   Code Status:   full Family Communication: bedside Disposition Plan: admit   Total time spent 55 minutes.Greater than 50% of this time was spent in counseling, explanation of diagnosis, planning of further management, and coordination of care  Clydie Braun [facility] Pager [phone]  If 7PM-7AM, please contact night-coverage [email or web address] Password [facility] 11/07/2015, 3:34 AM

## 2015-11-07 NOTE — Consult Note (Signed)
ORTHOPAEDIC CONSULTATION  REQUESTING PHYSICIAN: Albertine GratesFang Xu, MD  PCP:  Pcp Not In System  Chief Complaint: right ankle injury  HPI: Alec Brown is a 60 y.o. male who complains of  Right ankle injury after syncopal episode yesterday. Xrays in ED showed distal fibula fx and fibular neck fx. Splint applied in ED.  Past Medical History  Diagnosis Date  . Seizures (HCC)   . Hypertension   . Hypercholesterolemia    Past Surgical History  Procedure Laterality Date  . Tonsillectomy    . Hernia repair     Social History   Social History  . Marital Status: Divorced    Spouse Name: N/A  . Number of Children: N/A  . Years of Education: N/A   Social History Main Topics  . Smoking status: Former Games developermoker  . Smokeless tobacco: Never Used  . Alcohol Use: Yes     Comment: occasional  . Drug Use: No  . Sexual Activity: Yes   Other Topics Concern  . None   Social History Narrative  . None   History reviewed. No pertinent family history. No Known Allergies Prior to Admission medications   Medication Sig Start Date End Date Taking? Authorizing Provider  acetaminophen (TYLENOL) 500 MG tablet Take 500 mg by mouth every 6 (six) hours as needed (for pain.).   Yes Historical Provider, MD  hydrochlorothiazide (HYDRODIURIL) 25 MG tablet Take 12.5 mg by mouth daily.   Yes Historical Provider, MD  ibuprofen (ADVIL,MOTRIN) 200 MG tablet Take 200 mg by mouth every 6 (six) hours as needed (for pain.).   Yes Historical Provider, MD  meloxicam (MOBIC) 7.5 MG tablet Take 7.5 mg by mouth daily.   Yes Historical Provider, MD  potassium chloride SA (K-DUR,KLOR-CON) 20 MEQ tablet Take 40 mEq by mouth daily.   Yes Historical Provider, MD  prazosin (MINIPRESS) 5 MG capsule Take 5 mg by mouth at bedtime.   Yes Historical Provider, MD  simvastatin (ZOCOR) 80 MG tablet Take 120 mg by mouth daily.   Yes Historical Provider, MD  tamsulosin (FLOMAX) 0.4 MG CAPS capsule Take 0.4 mg by mouth.   Yes Historical  Provider, MD  traZODone (DESYREL) 50 MG tablet Take 50 mg by mouth at bedtime.   Yes Historical Provider, MD  vardenafil (LEVITRA) 20 MG tablet Take 20 mg by mouth daily as needed for erectile dysfunction.   Yes Historical Provider, MD  venlafaxine XR (EFFEXOR-XR) 150 MG 24 hr capsule Take 150 mg by mouth daily with breakfast.   Yes Historical Provider, MD  vitamin B-12 (CYANOCOBALAMIN) 500 MCG tablet Take 1,000 mcg by mouth daily.   Yes Historical Provider, MD   Dg Chest 2 View  11/07/2015  CLINICAL DATA:  Acute onset of seizure and fall. Shortness of breath. Initial encounter. EXAM: CHEST  2 VIEW COMPARISON:  None. FINDINGS: The lungs are well-aerated and clear. There is no evidence of focal opacification, pleural effusion or pneumothorax. The heart is normal in size; the mediastinal contour is within normal limits. No acute osseous abnormalities are seen. IMPRESSION: No acute cardiopulmonary process seen. No displaced rib fractures identified. Electronically Signed   By: Roanna RaiderJeffery  Chang M.D.   On: 11/07/2015 02:03   Dg Knee 2 Views Right  11/07/2015  CLINICAL DATA:  Status post seizure and fall. Right lateral knee pain. Initial encounter. EXAM: RIGHT KNEE - 1-2 VIEW COMPARISON:  None. FINDINGS: There is a minimally displaced fracture through the proximal fibular diaphysis, demonstrating mild anterior and lateral displacement. No  additional fractures are seen. No knee joint effusion is seen. The knee joint is grossly unremarkable in appearance, aside from mild marginal osteophyte formation at the medial compartment. Mild soft tissue swelling is noted overlying the fracture. IMPRESSION: Minimally displaced fracture through the proximal fibular diaphysis, demonstrating mild anterior and lateral displacement. Electronically Signed   By: Roanna Raider M.D.   On: 11/07/2015 02:08   Dg Ankle Complete Left  11/07/2015  CLINICAL DATA:  Status post fall, with left lateral ankle and foot pain. Initial encounter.  EXAM: LEFT ANKLE COMPLETE - 3+ VIEW COMPARISON:  None. FINDINGS: There is no evidence of fracture or dislocation. The ankle mortise is intact; the interosseous space is within normal limits. No talar tilt or subluxation is seen. The joint spaces are preserved. No significant soft tissue abnormalities are seen. IMPRESSION: No evidence of fracture or dislocation. Electronically Signed   By: Roanna Raider M.D.   On: 11/07/2015 02:09   Dg Ankle Complete Right  11/07/2015  CLINICAL DATA:  Status post seizure and fall. Right medial ankle abrasion. Initial encounter. EXAM: RIGHT ANKLE - COMPLETE 3+ VIEW COMPARISON:  None. FINDINGS: There is a mildly displaced fracture through the distal fibula. No additional fractures are seen. The ankle mortise is grossly unremarkable in appearance. The interosseous space appears grossly intact. The subtalar joint is unremarkable. Os trigonum fragments are noted. Lateral soft tissue swelling is noted. IMPRESSION: 1. Mildly displaced fracture through the distal fibula. 2. Os trigonum fragments noted. Electronically Signed   By: Roanna Raider M.D.   On: 11/07/2015 02:11   Dg Foot Complete Left  11/07/2015  CLINICAL DATA:  Status post seizure and fall. Left foot pain. Initial encounter. EXAM: LEFT FOOT - COMPLETE 3+ VIEW COMPARISON:  None. FINDINGS: There is no evidence of fracture or dislocation. The joint spaces are preserved. There is no evidence of talar subluxation; the subtalar joint is unremarkable in appearance. Mild hallux valgus is noted. No significant soft tissue abnormalities are seen. IMPRESSION: 1. No evidence of fracture or dislocation. 2. Mild hallux valgus noted. Electronically Signed   By: Roanna Raider M.D.   On: 11/07/2015 02:06   Dg Foot Complete Right  11/07/2015  CLINICAL DATA:  Status post seizure and fall. Medial right foot pain. Initial encounter. EXAM: RIGHT FOOT COMPLETE - 3+ VIEW COMPARISON:  None. FINDINGS: There is no evidence of fracture or  dislocation. The joint spaces are preserved. There is no evidence of talar subluxation; the subtalar joint is unremarkable in appearance. An os trigonum is noted. No significant soft tissue abnormalities are seen. IMPRESSION: 1. No evidence of fracture or dislocation. 2. Os trigonum noted. Electronically Signed   By: Roanna Raider M.D.   On: 11/07/2015 02:04    Positive ROS: All other systems have been reviewed and were otherwise negative with the exception of those mentioned in the HPI and as above.  Physical Exam: General: Alert, no acute distress Cardiovascular: No pedal edema Respiratory: No cyanosis, no use of accessory musculature GI: No organomegaly, abdomen is soft and non-tender Skin: No lesions in the area of chief complaint Neurologic: Sensation intact distally Psychiatric: Patient is competent for consent with normal mood and affect Lymphatic: No axillary or cervical lymphadenopathy  MUSCULOSKELETAL:   RLE: splint intact. Wiggles toes. BCR, SILT toes. LLE: no swelling, ecchymosis, crepitus.  Assessment: R distal fibula fx (Weber A) R fibular neck fx  Plan: NWB RLE Cont splint Ice, elevation F/u in the office 1 week after d/c  Essie Gehret, Cloyde Reams, MD Cell [phone]    11/07/2015 7:37 AM

## 2015-11-07 NOTE — Progress Notes (Signed)
PT Cancellation Note  Patient Details Name: Alec MulletStacy Brown MRN: [id number] DOB: 1956-09-27   Cancelled Treatment:    Reason Eval/Treat Not Completed: Other (comment) (eating lunch, now  have NWB orders. will check back later.)   Sharen HeckHill, Darina Hartwell [name] Jigar Zielke PT [phone]   11/07/2015, 12:14 PM

## 2015-11-08 DIAGNOSIS — N400 Enlarged prostate without lower urinary tract symptoms: Secondary | ICD-10-CM

## 2015-11-08 DIAGNOSIS — I4581 Long QT syndrome: Secondary | ICD-10-CM

## 2015-11-08 DIAGNOSIS — R55 Syncope and collapse: Secondary | ICD-10-CM

## 2015-11-08 DIAGNOSIS — S82891A Other fracture of right lower leg, initial encounter for closed fracture: Secondary | ICD-10-CM | POA: Diagnosis not present

## 2015-11-08 DIAGNOSIS — I951 Orthostatic hypotension: Secondary | ICD-10-CM

## 2015-11-08 DIAGNOSIS — S82401A Unspecified fracture of shaft of right fibula, initial encounter for closed fracture: Secondary | ICD-10-CM

## 2015-11-08 LAB — COMPREHENSIVE METABOLIC PANEL
ALK PHOS: 68 U/L (ref 38–126)
ALT: 20 U/L (ref 17–63)
AST: 18 U/L (ref 15–41)
Albumin: 3.2 g/dL — ABNORMAL LOW (ref 3.5–5.0)
Anion gap: 6 (ref 5–15)
BUN: 11 mg/dL (ref 6–20)
CALCIUM: 8.7 mg/dL — AB (ref 8.9–10.3)
CO2: 29 mmol/L (ref 22–32)
CREATININE: 0.86 mg/dL (ref 0.61–1.24)
Chloride: 108 mmol/L (ref 101–111)
Glucose, Bld: 102 mg/dL — ABNORMAL HIGH (ref 65–99)
Potassium: 4.3 mmol/L (ref 3.5–5.1)
Sodium: 143 mmol/L (ref 135–145)
Total Bilirubin: 0.8 mg/dL (ref 0.3–1.2)
Total Protein: 5.4 g/dL — ABNORMAL LOW (ref 6.5–8.1)

## 2015-11-08 LAB — CBC
HCT: 37.7 % — ABNORMAL LOW (ref 39.0–52.0)
HEMOGLOBIN: 12.4 g/dL — AB (ref 13.0–17.0)
MCH: 31.3 pg (ref 26.0–34.0)
MCHC: 32.9 g/dL (ref 30.0–36.0)
MCV: 95.2 fL (ref 78.0–100.0)
Platelets: 196 10*3/uL (ref 150–400)
RBC: 3.96 MIL/uL — AB (ref 4.22–5.81)
RDW: 12.9 % (ref 11.5–15.5)
WBC: 6.7 10*3/uL (ref 4.0–10.5)

## 2015-11-08 LAB — CORTISOL: CORTISOL PLASMA: 1.1 ug/dL

## 2015-11-08 LAB — TSH: TSH: 4.543 u[IU]/mL — AB (ref 0.350–4.500)

## 2015-11-08 MED ORDER — TAMSULOSIN HCL 0.4 MG PO CAPS: 0.4000 mg | ORAL_CAPSULE | Freq: Every day | ORAL | Status: AC

## 2015-11-08 MED ORDER — HYDROCODONE-ACETAMINOPHEN 5-325 MG PO TABS: 1.0000 | ORAL_TABLET | ORAL | Status: AC | PRN

## 2015-11-08 NOTE — Progress Notes (Signed)
[facility] for HHPT and DME.  Spoke with Oneida CastleJoey, RN, CM [phone] Ext [phone] and faxed D/C summary and HH orders/DME orders to Fax [phone]. [facility] and given to in house rep. Pt ws in bathroom information shared with pt's sister at bedside.

## 2015-11-08 NOTE — Discharge Summary (Signed)
Physician Discharge Summary  Alec Brown NWG:[id number] DOB: [birth year] DOA: 11/06/2015  PCP: Pcp Not In System  Admit date: 11/06/2015 Discharge date: 11/08/2015  Time spent: 45 minutes  Recommendations for Outpatient Follow-up:  Patient will be discharged to home with home health PT (to be arranged through the [place]).  Patient will need to follow up with primary care provider within one week of discharge.  Follow up with Dr. Linna Brown, orthopedist, in one week.  No weight bearing on Right leg. Patient should continue medications as prescribed.  Patient should follow a heart healthy diet.   Discharge Diagnoses:  Syncope Hypertension Right fibular fracture Hypokalemia Prolonged QTC BPH  Discharge Condition: Stable  Diet recommendation: Heart healthy  Filed Weights   11/06/15 2338  Weight: 106.595 kg (235 lb)    History of present illness:  On 11/07/2015 by Dr. Madelyn Flavors Alec Brown is a 60 year old male with a past medical history of seizure disorder, hypertension, hyperlipidemia, BPH; who presents to the emergency department after having a syncopal event. Symptoms occurred around 10:30 PM after he had just taken his nighttime medications of trazodone, Flomax, and prazosin. This was the first time he had taken Flomax which was just prescribed. As he was walking from the couch he noted filling lightheaded. His significant other notes that she found him laying on the floor slightly confused and dazed for a few minutes. Patient also denies recently taking his erectile dysfunction medication. Denies any trauma to the head, possible bowel or bladder, seizure-like activity, or tongue biting. Denies having any syncopal episodes in the past. Yesterday, he took his blood pressure and his systolic blood pressure was in the 130's. He complains of left ankle and knee pain. Pain is not sharp in nature and worsened by any movement of the left leg.  Hospital Course:  Syncope -Likely secondary to  hypotension as patient did have a combination of tramadol, Flomax, prazosin at once -Patient's BP was soft admission, he was given 2 L IV fluid emergency department, BPs normalized -Orthostatic vitals were positive during admission -Physical therapy evaluated patient and recommended home health with walker  Hypotension -Secondary to multiple medications at once including HCTZ, prazosin, Flomax -BP upon admission was 60/46 in the emergency department, this did resolve with IV fluids  Right Fibular fracture -Patient has distal and proximal right fibular fracture noted on x-ray -Orthopedic surgery, consulted appreciated, recommended splint, NWB on RLE, follow up in one week -Continue pain control as needed  Hypokalemia -Resolved  Prolonged QTC -Resolved on repeat EKG on 11/07/2015  BPH -Patient trucks on take his Flomax as well as prazosin -Patient is to follow-up with his urologist upon discharge  Procedures: None  Consultations: None  Discharge Exam: Filed Vitals:   11/07/15 2034 11/08/15 0500  BP: 90/55 107/82  Pulse: 75 74  Temp: 97.9 F (36.6 C) 97.7 F (36.5 C)  Resp: 16 16     General: Well developed, well nourished, NAD, appears stated age  HEENT: NCAT,  mucous membranes moist.  Cardiovascular: S1 S2 auscultated, no rubs, murmurs or gallops. Regular rate and rhythm.  Respiratory: Clear to auscultation bilaterally with equal chest rise  Abdomen: Soft, nontender, nondistended, + bowel sounds  Extremities: warm dry without cyanosis clubbing or edema.  RLE in splint  Neuro: AAOx3, nonfocal  Psych: Normal affect and demeanor  Discharge Instructions      Discharge Instructions    Discharge instructions    Complete by:  As directed   Patient will be discharged to  home with home health PT (to be arranged through the [place]).  Patient will need to follow up with primary care provider within one week of discharge.  Follow up with Dr. Linna Brown, orthopedist, in  one week.  No weight bearing on Right leg. Patient should continue medications as prescribed.  Patient should follow a heart healthy diet.            Medication List    STOP taking these medications        hydrochlorothiazide 25 MG tablet  Commonly known as:  HYDRODIURIL     vardenafil 20 MG tablet  Commonly known as:  LEVITRA      TAKE these medications        acetaminophen 500 MG tablet  Commonly known as:  TYLENOL  Take 500 mg by mouth every 6 (six) hours as needed (for pain.).     ibuprofen 200 MG tablet  Commonly known as:  ADVIL,MOTRIN  Take 200 mg by mouth every 6 (six) hours as needed (for pain.).     meloxicam 7.5 MG tablet  Commonly known as:  MOBIC  Take 7.5 mg by mouth daily.     potassium chloride SA 20 MEQ tablet  Commonly known as:  K-DUR,KLOR-CON  Take 40 mEq by mouth daily.     prazosin 5 MG capsule  Commonly known as:  MINIPRESS  Take 5 mg by mouth at bedtime.     simvastatin 80 MG tablet  Commonly known as:  ZOCOR  Take 120 mg by mouth daily.     tamsulosin 0.4 MG Caps capsule  Commonly known as:  FLOMAX  Take 1 capsule (0.4 mg total) by mouth daily after breakfast.     traZODone 50 MG tablet  Commonly known as:  DESYREL  Take 50 mg by mouth at bedtime.     venlafaxine XR 150 MG 24 hr capsule  Commonly known as:  EFFEXOR-XR  Take 150 mg by mouth daily with breakfast.     vitamin B-12 500 MCG tablet  Commonly known as:  CYANOCOBALAMIN  Take 1,000 mcg by mouth daily.       No Known Allergies Follow-up Information    Follow up with Swinteck, Cloyde Reams, MD. Schedule an appointment as soon as possible for a visit in 1 week.   Specialty:  Orthopedic Surgery   Why:  repeat xrays   Contact information:   [address] [place] [address] [phone]       Follow up with oved to [facility].       The results of significant diagnostics from this hospitalization (including imaging, microbiology, ancillary and  laboratory) are listed below for reference.    Significant Diagnostic Studies: Dg Chest 2 View  11/07/2015  CLINICAL DATA:  Acute onset of seizure and fall. Shortness of breath. Initial encounter. EXAM: CHEST  2 VIEW COMPARISON:  None. FINDINGS: The lungs are well-aerated and clear. There is no evidence of focal opacification, pleural effusion or pneumothorax. The heart is normal in size; the mediastinal contour is within normal limits. No acute osseous abnormalities are seen. IMPRESSION: No acute cardiopulmonary process seen. No displaced rib fractures identified. Electronically Signed   By: Roanna Raider M.D.   On: 11/07/2015 02:03   Dg Knee 2 Views Right  11/07/2015  CLINICAL DATA:  Status post seizure and fall. Right lateral knee pain. Initial encounter. EXAM: RIGHT KNEE - 1-2 VIEW COMPARISON:  None. FINDINGS: There is a minimally displaced fracture through the  proximal fibular diaphysis, demonstrating mild anterior and lateral displacement. No additional fractures are seen. No knee joint effusion is seen. The knee joint is grossly unremarkable in appearance, aside from mild marginal osteophyte formation at the medial compartment. Mild soft tissue swelling is noted overlying the fracture. IMPRESSION: Minimally displaced fracture through the proximal fibular diaphysis, demonstrating mild anterior and lateral displacement. Electronically Signed   By: Roanna Raider M.D.   On: 11/07/2015 02:08   Dg Ankle Complete Left  11/07/2015  CLINICAL DATA:  Status post fall, with left lateral ankle and foot pain. Initial encounter. EXAM: LEFT ANKLE COMPLETE - 3+ VIEW COMPARISON:  None. FINDINGS: There is no evidence of fracture or dislocation. The ankle mortise is intact; the interosseous space is within normal limits. No talar tilt or subluxation is seen. The joint spaces are preserved. No significant soft tissue abnormalities are seen. IMPRESSION: No evidence of fracture or dislocation. Electronically Signed   By:  Roanna Raider M.D.   On: 11/07/2015 02:09   Dg Ankle Complete Right  11/07/2015  CLINICAL DATA:  Status post seizure and fall. Right medial ankle abrasion. Initial encounter. EXAM: RIGHT ANKLE - COMPLETE 3+ VIEW COMPARISON:  None. FINDINGS: There is a mildly displaced fracture through the distal fibula. No additional fractures are seen. The ankle mortise is grossly unremarkable in appearance. The interosseous space appears grossly intact. The subtalar joint is unremarkable. Os trigonum fragments are noted. Lateral soft tissue swelling is noted. IMPRESSION: 1. Mildly displaced fracture through the distal fibula. 2. Os trigonum fragments noted. Electronically Signed   By: Roanna Raider M.D.   On: 11/07/2015 02:11   Dg Foot Complete Left  11/07/2015  CLINICAL DATA:  Status post seizure and fall. Left foot pain. Initial encounter. EXAM: LEFT FOOT - COMPLETE 3+ VIEW COMPARISON:  None. FINDINGS: There is no evidence of fracture or dislocation. The joint spaces are preserved. There is no evidence of talar subluxation; the subtalar joint is unremarkable in appearance. Mild hallux valgus is noted. No significant soft tissue abnormalities are seen. IMPRESSION: 1. No evidence of fracture or dislocation. 2. Mild hallux valgus noted. Electronically Signed   By: Roanna Raider M.D.   On: 11/07/2015 02:06   Dg Foot Complete Right  11/07/2015  CLINICAL DATA:  Status post seizure and fall. Medial right foot pain. Initial encounter. EXAM: RIGHT FOOT COMPLETE - 3+ VIEW COMPARISON:  None. FINDINGS: There is no evidence of fracture or dislocation. The joint spaces are preserved. There is no evidence of talar subluxation; the subtalar joint is unremarkable in appearance. An os trigonum is noted. No significant soft tissue abnormalities are seen. IMPRESSION: 1. No evidence of fracture or dislocation. 2. Os trigonum noted. Electronically Signed   By: Roanna Raider M.D.   On: 11/07/2015 02:04    Microbiology: No results found  for this or any previous visit (from the past 240 hour(s)).   Labs: Basic Metabolic Panel:  Recent Labs Lab 11/06/15 2351 11/07/15 0745 11/08/15 0512  NA 142 139 143  K 3.2* 3.6 4.3  CL 105 105 108  CO2 27 28 29   GLUCOSE 138* 135* 102*  BUN 12 14 11   CREATININE 1.08 1.09 0.86  CALCIUM 8.4* 8.2* 8.7*   Liver Function Tests:  Recent Labs Lab 11/08/15 0512  AST 18  ALT 20  ALKPHOS 68  BILITOT 0.8  PROT 5.4*  ALBUMIN 3.2*   No results for input(s): LIPASE, AMYLASE in the last 168 hours. No results for input(s): AMMONIA in  the last 168 hours. CBC:  Recent Labs Lab 11/06/15 2351 11/08/15 0512  WBC 8.1 6.7  HGB 13.2 12.4*  HCT 39.2 37.7*  MCV 91.8 95.2  PLT 197 196   Cardiac Enzymes: No results for input(s): CKTOTAL, CKMB, CKMBINDEX, TROPONINI in the last 168 hours. BNP: BNP (last 3 results) No results for input(s): BNP in the last 8760 hours.  ProBNP (last 3 results) No results for input(s): PROBNP in the last 8760 hours.  CBG:  Recent Labs Lab 11/07/15 0011  GLUCAP 124*       Signed:  Edsel PetrinMIKHAIL, Lakiyah Arntson  [facility] 11/08/2015, 11:11 AM

## 2015-11-08 NOTE — Discharge Instructions (Signed)

## 2015-11-08 NOTE — Progress Notes (Signed)
Physical Therapy Treatment Patient Details Name: Alec Brown MRN: [id number] DOB: 07/26/1956 Today's Date: 11/08/2015    History of Present Illness Alec Brown is a 60 y.o. male who complains of Right ankle injury after syncopal episode 11/05/14. Xrays in ED showed distal fibula fx and fibular neck fx. Splint applied in ED. NWB R foot    PT Comments    Blood pressure did not drop standing. Ambulated short distance today. Has assist at home. Recommend WC if able to get, and HHPT. Patient reports that he will use th SW .   Follow Up Recommendations  Home health PT;Supervision/Assistance - 24 hour     Equipment Recommendations  Wheelchair (measurements PT)    Recommendations for Other Services       Precautions / Restrictions Precautions Precautions: Fall Precaution Comments: syncope spell at home Restrictions RLE Weight Bearing: Non weight bearing    Mobility  Bed Mobility Overal bed mobility: Independent                Transfers Overall transfer level: Needs assistance Equipment used: Rolling walker (2 wheeled);Pushed w/c   Sit to Stand: Min guard         General transfer comment: from bed and recliner, cues for hand placement, safety  Ambulation/Gait Ambulation/Gait assistance: Min assist Ambulation Distance (Feet): 15 Feet (x 2) Assistive device: Rolling walker (2 wheeled) Gait Pattern/deviations: Step-to pattern     General Gait Details: maintained NWB, cues for safety, at times patient picked up the RW like SW. Brother now reports that walker is standard.   Stairs            Wheelchair Mobility    Modified Rankin (Stroke Patients Only)       Balance Overall balance assessment: History of Falls;Needs assistance   Sitting balance-Leahy Scale: Normal     Standing balance support: During functional activity;Bilateral upper extremity supported Standing balance-Leahy Scale: Fair                      Cognition  Arousal/Alertness: Awake/alert                          Exercises      General Comments        Pertinent Vitals/Pain Pain Score: 8  Pain Location: R leg Pain Descriptors / Indicators: Aching;Discomfort Pain Intervention(s): Limited activity within patient's tolerance;Monitored during session;Repositioned    Home Living                      Prior Function            PT Goals (current goals can now be found in the care plan section) Progress towards PT goals: Progressing toward goals    Frequency  Min 6X/week    PT Plan Current plan remains appropriate    Co-evaluation             End of Session Equipment Utilized During Treatment: Gait belt Activity Tolerance: Patient tolerated treatment well Patient left: in bed;with call bell/phone within reach;with family/visitor present     Time: [id number] PT Time Calculation (min) (ACUTE ONLY): 17 min  Charges:  $Gait Training: 8-22 mins                    G Codes:      Rada HayHill, Nabeel Gladson [name] 11/08/2015, 12:00 PM Blanchard KelchKaren Katori Wirsing PT [phone]

## 2017-06-05 IMAGING — CR DG KNEE 1-2V*R*
6 series · 6 of 6 positions shown · non-contrast
Comparison: None.

CLINICAL DATA: Status post seizure and fall. Right lateral knee
pain. Initial encounter.

EXAM:
RIGHT KNEE - 1-2 VIEW

[x knee ap right (1 of 2)]
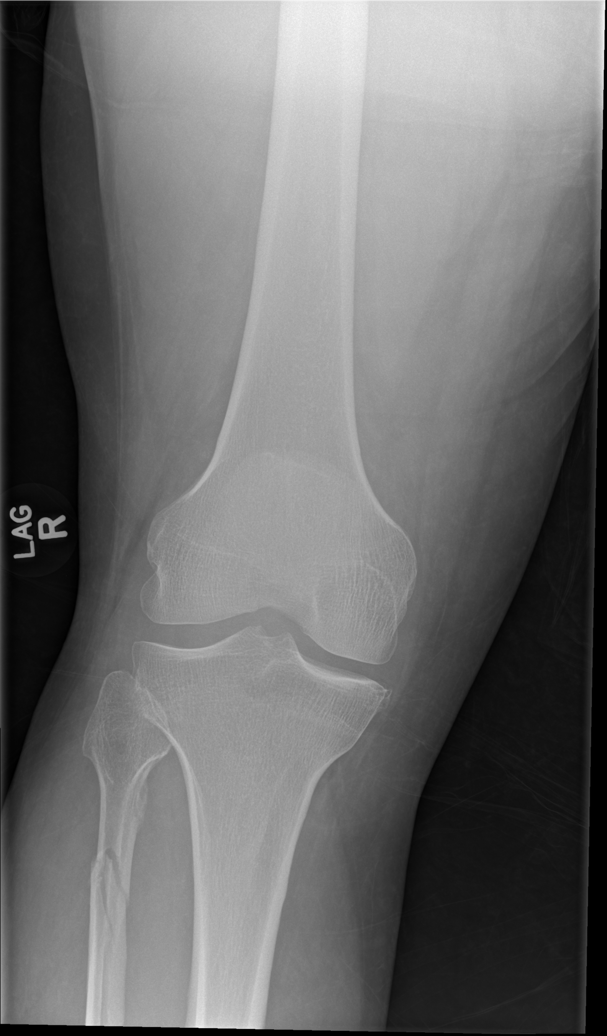

[x knee ap right (2 of 2)]
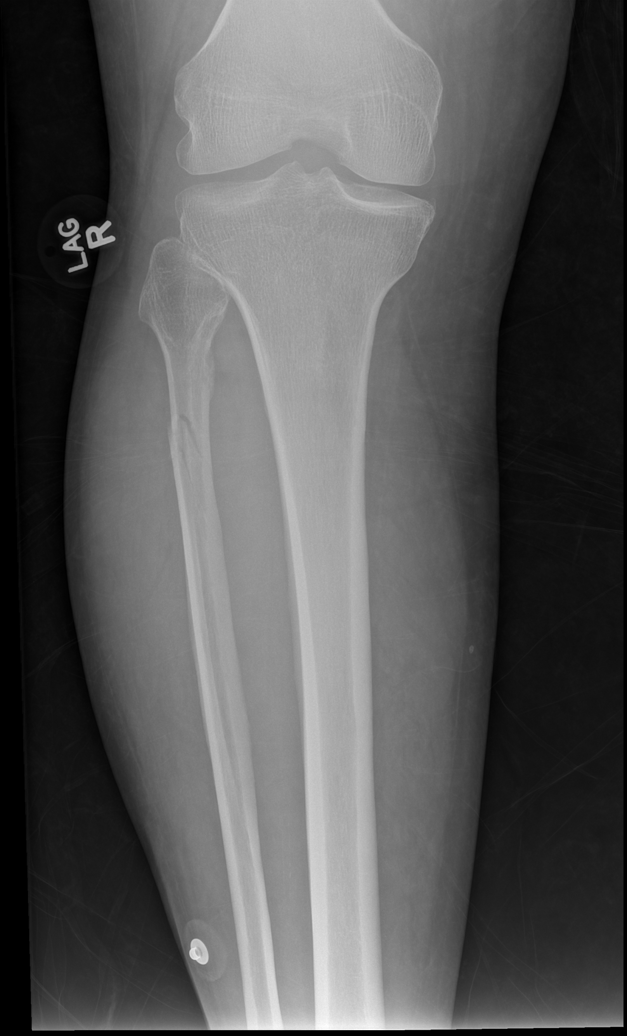

[x knee obl right (1 of 2)]
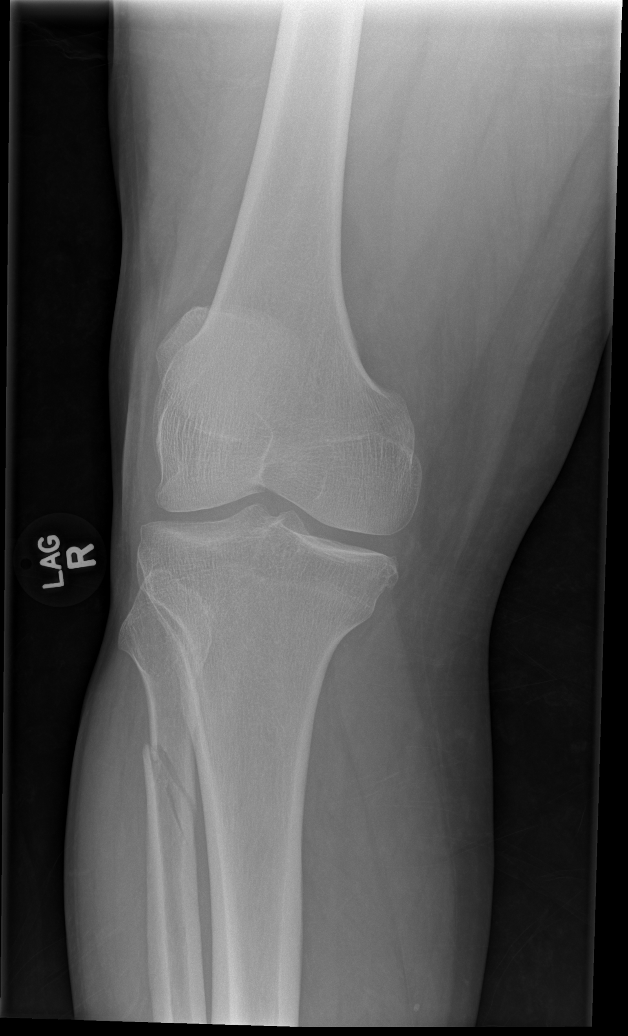

[x knee obl right (2 of 2)]
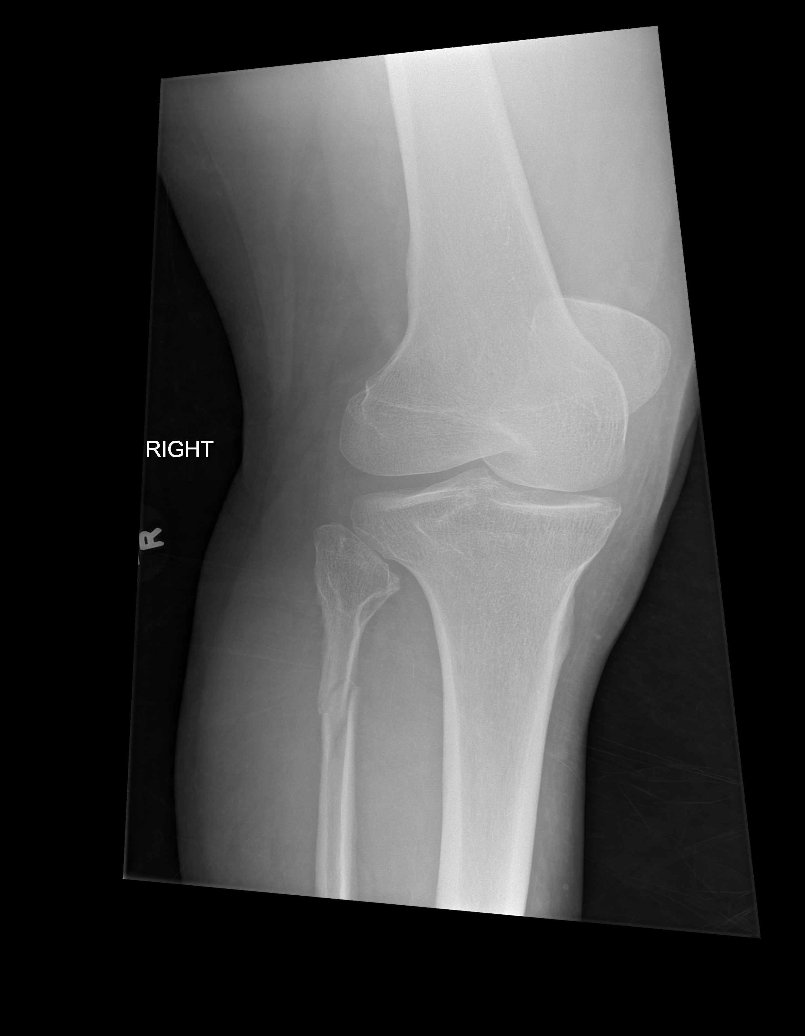

[x knee lat right (1 of 2)]
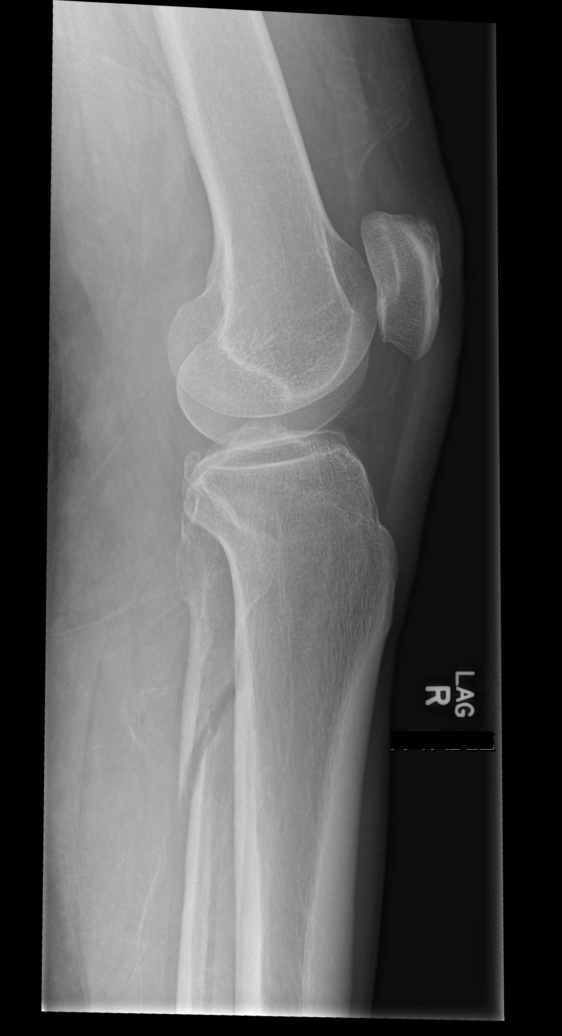

[x knee lat right (2 of 2)]
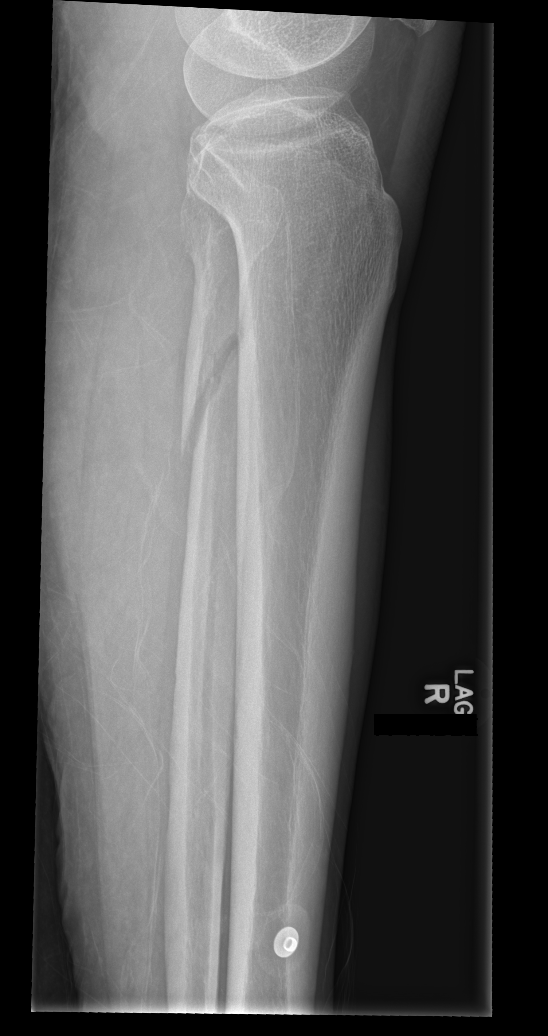

[6 of 6 positions shown; findings below may reference images not displayed]

FINDINGS: There is a minimally displaced fracture through the proximal fibular
diaphysis, demonstrating mild anterior and lateral displacement. No
additional fractures are seen.

No knee joint effusion is seen. The knee joint is grossly
unremarkable in appearance, aside from mild marginal osteophyte
formation at the medial compartment. Mild soft tissue swelling is
noted overlying the fracture.
IMPRESSION: Minimally displaced fracture through the proximal fibular diaphysis,
demonstrating mild anterior and lateral displacement.
